# Patient Record
Sex: Female | Born: 1935 | Hispanic: Yes | Marital: Married | Smoking: Never smoker
Health system: Southern US, Community
[De-identification: ages and names within clinical notes are randomized; demographics above are authoritative.]

## PROBLEM LIST (undated history)

## (undated) DIAGNOSIS — C801 Malignant (primary) neoplasm, unspecified: Secondary | ICD-10-CM

## (undated) DIAGNOSIS — I10 Essential (primary) hypertension: Secondary | ICD-10-CM

## (undated) HISTORY — PX: BACK SURGERY: SHX140

---

## 2015-10-08 ENCOUNTER — Encounter (HOSPITAL_COMMUNITY): Payer: Self-pay | Admitting: Emergency Medicine

## 2015-10-08 ENCOUNTER — Emergency Department (HOSPITAL_COMMUNITY): Payer: Self-pay

## 2015-10-08 ENCOUNTER — Observation Stay (HOSPITAL_COMMUNITY)
Admission: EM | Admit: 2015-10-08 | Discharge: 2015-10-09 | Disposition: A | Discharge status: Home / Self Care | Payer: Self-pay | Attending: Internal Medicine | Admitting: Internal Medicine

## 2015-10-08 DIAGNOSIS — E871 Hypo-osmolality and hyponatremia: Secondary | ICD-10-CM | POA: Insufficient documentation

## 2015-10-08 DIAGNOSIS — R112 Nausea with vomiting, unspecified: Secondary | ICD-10-CM | POA: Insufficient documentation

## 2015-10-08 DIAGNOSIS — R197 Diarrhea, unspecified: Secondary | ICD-10-CM | POA: Insufficient documentation

## 2015-10-08 DIAGNOSIS — R6510 Systemic inflammatory response syndrome (SIRS) of non-infectious origin without acute organ dysfunction: Principal | ICD-10-CM | POA: Insufficient documentation

## 2015-10-08 DIAGNOSIS — K8020 Calculus of gallbladder without cholecystitis without obstruction: Secondary | ICD-10-CM | POA: Insufficient documentation

## 2015-10-08 DIAGNOSIS — E861 Hypovolemia: Secondary | ICD-10-CM | POA: Insufficient documentation

## 2015-10-08 DIAGNOSIS — R000 Tachycardia, unspecified: Secondary | ICD-10-CM | POA: Insufficient documentation

## 2015-10-08 DIAGNOSIS — R1011 Right upper quadrant pain: Secondary | ICD-10-CM

## 2015-10-08 DIAGNOSIS — A419 Sepsis, unspecified organism: Secondary | ICD-10-CM | POA: Diagnosis present

## 2015-10-08 DIAGNOSIS — R109 Unspecified abdominal pain: Secondary | ICD-10-CM | POA: Insufficient documentation

## 2015-10-08 DIAGNOSIS — I10 Essential (primary) hypertension: Secondary | ICD-10-CM | POA: Insufficient documentation

## 2015-10-08 DIAGNOSIS — Z85828 Personal history of other malignant neoplasm of skin: Secondary | ICD-10-CM | POA: Insufficient documentation

## 2015-10-08 DIAGNOSIS — E860 Dehydration: Secondary | ICD-10-CM | POA: Insufficient documentation

## 2015-10-08 HISTORY — DX: Essential (primary) hypertension: I10

## 2015-10-08 HISTORY — DX: Malignant (primary) neoplasm, unspecified: C80.1

## 2015-10-08 LAB — URINE MICROSCOPIC-ADD ON: WBC, UA: NONE SEEN WBC/hpf (ref 0–5)

## 2015-10-08 LAB — COMPREHENSIVE METABOLIC PANEL
ALBUMIN: 3.2 g/dL — AB (ref 3.5–5.0)
ALK PHOS: 74 U/L (ref 38–126)
ALT: 30 U/L (ref 14–54)
AST: 33 U/L (ref 15–41)
Anion gap: 8 (ref 5–15)
BILIRUBIN TOTAL: 0.8 mg/dL (ref 0.3–1.2)
BUN: 13 mg/dL (ref 6–20)
CALCIUM: 8 mg/dL — AB (ref 8.9–10.3)
CO2: 19 mmol/L — ABNORMAL LOW (ref 22–32)
Chloride: 102 mmol/L (ref 101–111)
Creatinine, Ser: 0.96 mg/dL (ref 0.44–1.00)
GFR calc Af Amer: >60 mL/min (ref >60)
GFR, EST NON AFRICAN AMERICAN: 55 mL/min — AB (ref >60)
GLUCOSE: 123 mg/dL — AB (ref 65–99)
POTASSIUM: 4.2 mmol/L (ref 3.5–5.1)
Sodium: 129 mmol/L — ABNORMAL LOW (ref 135–145)
TOTAL PROTEIN: 6.7 g/dL (ref 6.5–8.1)

## 2015-10-08 LAB — URINALYSIS, ROUTINE W REFLEX MICROSCOPIC
Bilirubin Urine: NEGATIVE
Glucose, UA: NEGATIVE mg/dL
KETONES UR: NEGATIVE mg/dL
LEUKOCYTES UA: NEGATIVE
NITRITE: NEGATIVE
PROTEIN: 30 mg/dL — AB
Specific Gravity, Urine: 1.019 (ref 1.005–1.030)
pH: 5 (ref 5.0–8.0)

## 2015-10-08 LAB — CBC WITH DIFFERENTIAL/PLATELET
BASOS ABS: 0 10*3/uL (ref 0.0–0.1)
Basophils Relative: 0 %
Eosinophils Absolute: 0 10*3/uL (ref 0.0–0.7)
Eosinophils Relative: 0 %
HEMATOCRIT: 38.1 % (ref 36.0–46.0)
HEMOGLOBIN: 12.6 g/dL (ref 12.0–15.0)
LYMPHS PCT: 13 %
Lymphs Abs: 1 10*3/uL (ref 0.7–4.0)
MCH: 30.4 pg (ref 26.0–34.0)
MCHC: 33.1 g/dL (ref 30.0–36.0)
MCV: 91.8 fL (ref 78.0–100.0)
MONO ABS: 0.5 10*3/uL (ref 0.1–1.0)
Monocytes Relative: 7 %
NEUTROS ABS: 6.2 10*3/uL (ref 1.7–7.7)
NEUTROS PCT: 80 %
Platelets: 196 10*3/uL (ref 150–400)
RBC: 4.15 MIL/uL (ref 3.87–5.11)
RDW: 12.8 % (ref 11.5–15.5)
WBC: 7.7 10*3/uL (ref 4.0–10.5)

## 2015-10-08 LAB — I-STAT CG4 LACTIC ACID, ED
Lactic Acid, Venous: 0.95 mmol/L (ref 0.5–2.0)
Lactic Acid, Venous: 0.47 mmol/L — ABNORMAL LOW (ref 0.5–2.0)

## 2015-10-08 LAB — CK: Total CK: 148 U/L (ref 38–234)

## 2015-10-08 LAB — LIPASE, BLOOD: Lipase: 21 U/L (ref 11–51)

## 2015-10-08 MED ORDER — ONDANSETRON HCL 4 MG/2ML IJ SOLN
4.0000 mg | Freq: Once | INTRAMUSCULAR | Status: AC
  Administered 2015-10-08: 4 mg via INTRAVENOUS
  Filled 2015-10-08: qty 2

## 2015-10-08 MED ORDER — SODIUM CHLORIDE 0.9 % IV BOLUS (SEPSIS)
1000.0000 mL | Freq: Once | INTRAVENOUS | Status: AC
  Administered 2015-10-08: 1000 mL via INTRAVENOUS

## 2015-10-08 MED ORDER — SODIUM CHLORIDE 0.9 % IV BOLUS (SEPSIS): 1000.0000 mL | Freq: Once | INTRAVENOUS | Status: DC

## 2015-10-08 MED ORDER — IOPAMIDOL (ISOVUE-370) INJECTION 76%
INTRAVENOUS | Status: AC
  Administered 2015-10-08: 100 mL
  Filled 2015-10-08: qty 100

## 2015-10-08 MED ORDER — ACETAMINOPHEN 325 MG PO TABS
650.0000 mg | ORAL_TABLET | Freq: Once | ORAL | Status: AC
  Administered 2015-10-08: 650 mg via ORAL
  Filled 2015-10-08: qty 2

## 2015-10-08 MED ORDER — SODIUM CHLORIDE 0.9 % IV SOLN: 1000.0000 mL | INTRAVENOUS | Status: DC

## 2015-10-08 MED ORDER — PIPERACILLIN-TAZOBACTAM 3.375 G IVPB 30 MIN
3.3750 g | Freq: Once | INTRAVENOUS | Status: AC
  Administered 2015-10-08: 3.375 g via INTRAVENOUS
  Filled 2015-10-08: qty 50

## 2015-10-08 MED ORDER — PIPERACILLIN-TAZOBACTAM 3.375 G IVPB
3.3750 g | Freq: Three times a day (TID) | INTRAVENOUS | Status: DC
  Administered 2015-10-09: 3.375 g via INTRAVENOUS
  Filled 2015-10-08 (×3): qty 50

## 2015-10-08 NOTE — ED Notes (Signed)
MD advised RN obtained PO temp EDP advised okay not to get the rectal.

## 2015-10-08 NOTE — ED Notes (Signed)
Patient brought back to the room. Patient connected to the monitor at this time. Family at bedside.

## 2015-10-08 NOTE — Progress Notes (Signed)
Pharmacy Antibiotic Note  Megan Watts is a 82 y.o. female admitted on 10/08/2015 with sepsis.  Pharmacy has been consulted for Zosyn dosing.  WBC 7.7, Tmax 100.53F. Cr 0.96, CrCl ~40 ml/min. Tachycardic to 130s. Received Zosyn 3.375g x 1 in ED.  Plan: Zosyn 3.375g IV q8h (4 hour infusion).  Height: 5' 2.21" (158 cm) Weight: 137 lb (62.143 kg) IBW/kg (Calculated) : 50.57  Temp (24hrs), Avg:99.7 F (37.6 C), Min:98.8 F (37.1 C), Max:100.6 F (38.1 C)   Recent Labs Lab 10/08/15 1617  WBC 7.7  CREATININE 0.96    Estimated Creatinine Clearance: 41.4 mL/min (by C-G formula based on Cr of 0.96).    Allergies  Allergen Reactions  . Codeine     Sleep all the time. Made my face red   . Dexamethasone     Rash on face   . Diclofenac     Rash on face   . Tramadol     Shaking     Antimicrobials this admission: 5/23 Zosyn >>    Dose adjustments this admission: n/a  Microbiology results: 5/23 BCx:  5/23 UCx:   5/23 GI panel  Thank you for allowing pharmacy to be a part of this patient's care.  Wynelle Fanny 10/08/2015 6:36 PM

## 2015-10-08 NOTE — H&P (Addendum)
History and Physical    Megan Watts [id number] DOB: July 27, 1933 DOA: 10/08/2015  Referring MD/NP/PA: Dr. PCP: No PCP Per Patient  Patient coming from: Home  Chief Complaint: Nausea, vomiting, and diarrhea  HPI: Megan Watts is a 82 y.o. female with medical history significant of hypertension and skin cancer s/p removal; who presents with complaints of nausea, vomiting, and diarrhea last 3 days. Patient is Spanish-speaking only and visiting from [place] since last week. The patient's son helps interpret for the patient. She reports having to 3 episodes of nausea and vomiting per day and up to 10 episodes of loose watery stools per day since the onset of symptoms. Only able to eat small amounts of food. Denies  anything in particular worsening/relieving symptoms. Recent sick contacts include her granddaughter who had similar illness last week. She had two 2 hour plane flights to get here and denies having any lower leg swelling or pain. Associated symptoms include fatigue, increased lethargy, decreased appetite, intermittent right-sided abdominal pain that was sharp( now resolved).  Patient denies any significant cough,  shortness of breath, chest pain, palpitations, blood in urine or stool, rash, change in vision, or focal weakness.   ED Course: Upon admission patient was evaluated and seen to have fever of 100.54F, heart rates up to 136, otherwise all other vital signs within normal limits. Lab work was relatively unremarkable except for sodium of 129, bicarbonate 19. Urinalysis was unremarkable. Chest x-ray showing signs of a patchy right middle lobe opacity. CT angiogram of the chest showing only signs of some atelectasis, but no acute pulmonary infiltrate or pulmonary embolism. Patient had been empirically given Zosyn for the possibility of aspiration pneumonia.   Review of Systems: As per HPI otherwise 10 point review of systems negative.   Past Medical History   Diagnosis Date  . Hypertension   . Cancer [facility])     Past Surgical History  Procedure Laterality Date  . Back surgery       reports that she has never smoked. She does not have any smokeless tobacco history on file. She reports that she does not drink alcohol or use illicit drugs.  Allergies  Allergen Reactions  . Codeine     Sleep all the time. Made my face red   . Dexamethasone     Rash on face   . Diclofenac     Rash on face   . Tramadol     Shaking     Family history reviewed and patient provides no pertinent history.  Prior to Admission medications   Medication Sig Start Date End Date Taking? Authorizing Provider  Carvedilol (COREG PO) Take 1 tablet by mouth 2 (two) times daily. Patient was unaware of dose   Yes Historical Provider, MD  losartan (COZAAR) 50 MG tablet Take 50 mg by mouth daily.   Yes Historical Provider, MD    Physical Exam: Filed Vitals:   10/08/15 2200 10/08/15 2215 10/08/15 2230 10/08/15 2338  BP: 107/62 109/65 116/66   Pulse: 80 78 79 98  Temp:      TempSrc:      Resp: 19 10 18 16   Height:      Weight:      SpO2: 97% 96% 97% 99%      Constitutional: NAD, calm, comfortable Filed Vitals:   10/08/15 2200 10/08/15 2215 10/08/15 2230 10/08/15 2338  BP: 107/62 109/65 116/66   Pulse: 80 78 79 98  Temp:      TempSrc:  Resp: 19 10 18 16   Height:      Weight:      SpO2: 97% 96% 97% 99%   Eyes: PERRL, lids and conjunctivae normal ENMT: Mucous membranes are moist. Posterior pharynx clear of any exudate or lesions.Normal dentition.  Neck: normal, supple, no masses, no thyromegaly Respiratory: clear to auscultation bilaterally, no wheezing, no crackles. Normal respiratory effort. No accessory muscle use.  Cardiovascular: Regular rate and rhythm, no murmurs / rubs / gallops. No extremity edema. 2+ pedal pulses. No carotid bruits.  Abdomen: no tenderness, no masses palpated. No hepatosplenomegaly. Bowel sounds positive.  Musculoskeletal:  no clubbing / cyanosis. No joint deformity upper and lower extremities. Good ROM, no contractures. Normal muscle tone.  Skin: no rashes, lesions, ulcers. No induration Neurologic: CN 2-12 grossly intact. Sensation intact, DTR normal. Strength 5/5 in all 4.  Psychiatric: Normal judgment and insight. Alert and oriented x 3. Normal mood.     Labs on Admission: I have personally reviewed following labs and imaging studies  CBC:  Recent Labs Lab 10/08/15 1617  WBC 7.7  NEUTROABS 6.2  HGB 12.6  HCT 38.1  MCV 91.8  PLT 123456   Basic Metabolic Panel:  Recent Labs Lab 10/08/15 1617  NA 129*  K 4.2  CL 102  CO2 19*  GLUCOSE 123*  BUN 13  CREATININE 0.96  CALCIUM 8.0*   GFR: Estimated Creatinine Clearance: 41.4 mL/min (by C-G formula based on Cr of 0.96). Liver Function Tests:  Recent Labs Lab 10/08/15 1617  AST 33  ALT 30  ALKPHOS 74  BILITOT 0.8  PROT 6.7  ALBUMIN 3.2*    Recent Labs Lab 10/08/15 1617  LIPASE 21   No results for input(s): AMMONIA in the last 168 hours. Coagulation Profile: No results for input(s): INR, PROTIME in the last 168 hours. Cardiac Enzymes:  Recent Labs Lab 10/08/15 1812  CKTOTAL 148   BNP (last 3 results) No results for input(s): PROBNP in the last 8760 hours. HbA1C: No results for input(s): HGBA1C in the last 72 hours. CBG: No results for input(s): GLUCAP in the last 168 hours. Lipid Profile: No results for input(s): CHOL, HDL, LDLCALC, TRIG, CHOLHDL, LDLDIRECT in the last 72 hours. Thyroid Function Tests: No results for input(s): TSH, T4TOTAL, FREET4, T3FREE, THYROIDAB in the last 72 hours. Anemia Panel: No results for input(s): VITAMINB12, FOLATE, FERRITIN, TIBC, IRON, RETICCTPCT in the last 72 hours. Urine analysis:    Component Value Date/Time   COLORURINE YELLOW 10/08/2015 [place] 10/08/2015 1657   LABSPEC 1.019 10/08/2015 1657   PHURINE 5.0 10/08/2015 1657   GLUCOSEU NEGATIVE 10/08/2015 1657    HGBUR MODERATE* 10/08/2015 1657   [place] 10/08/2015 [place] 10/08/2015 1657   PROTEINUR 30* 10/08/2015 1657   NITRITE NEGATIVE 10/08/2015 1657   LEUKOCYTESUR NEGATIVE 10/08/2015 1657   Sepsis Labs: No results found for this or any previous visit (from the past 240 hour(s)).   Radiological Exams on Admission: Dg Chest 2 View  10/08/2015  CLINICAL DATA:  Fever EXAM: CHEST  2 VIEW COMPARISON:  None. FINDINGS: Normal heart size. Normal mediastinal contour. No pneumothorax. No pleural effusion. Patchy right middle lobe opacity. No overt pulmonary edema. IMPRESSION: Patchy right middle lobe opacity, which could represent a pneumonia. Recommend follow-up PA and lateral post treatment chest radiographs in 4-6 weeks. Electronically Signed   By: Ilona Sorrel M.D.   On: 10/08/2015 19:24    EKG: Independently reviewed. Sinus tachycardia  Assessment/Plan  SIRS/ Sepsis with unknown source: Acute. Patient with complaints of abdominal pain ,nausea, vomiting, and diarrhea. Fever up to 100.76F, tachycardia and heart rates up to 136. Urinalysis clear of any signs of infection. Chest x-ray showing possible right lower lobe filtrate versus atelectasis. CT angiogram of the chest showing no signs of a pneumonia only atelectasis. Question underlying source at this time to be possibly intra-abdominal like secondary to gastroenteritis that could be bacterial. - Admit to a telemetry bed - Sepsis protocol initiated - Follow-up cultures  - Empiric antibiotics of Zosyn started, descalate when able  Nausea, vomiting, and diarrhea -  Zofran prn - Follow-up gastrointestinal panel  Intermittent right upper quadrant abdominal pain: CT anterior showing cholelithiasis. - Check abdominal ultrasound   Sinus tachycardia - Follow telemetry overnight - Check troponin    Essential hypertension   - Continue losartan - Will need to verify coreg dose  Hyponatremia: Acute. Sodium 129 on  admission with decrease CO2 of 19 suspect related to recent diarrhea - Follow repeat BMP in a.m.  DVT prophylaxis:   Lovenox  Code Status: Full Family Communication: Discussed plan with patient's son present at bedside Disposition Plan: Possible discharge in 1-2 days Consults called: None Admission status: Telemetry observation    Norval Morton MD [facility] Pager [phone]  If 7PM-7AM, please contact night-coverage [email or web address] Password [facility]  10/08/2015, 11:53 PM

## 2015-10-08 NOTE — ED Notes (Addendum)
Pt visiting here from [place] since last week.  Has had nausea, vomiting and diarrhea x's 3 days.  Pt also c/o abd pain, right side of head pain and right ear pain

## 2015-10-08 NOTE — ED Notes (Signed)
MD at bedside, HR noted to be increased to 130s, EKG completed.

## 2015-10-08 NOTE — Progress Notes (Signed)
Pharmacy Code Sepsis Protocol  Time of code sepsis page: [id number] []  Antibiotics delivered at  [x]  Antibiotics administered prior to code at [place] (if checked, omit next 2 questions)  Were antibiotics ordered at the time of the code sepsis page? Yes Was it required to contact the physician? []  Physician not contacted []  Physician contacted to order antibiotics for code sepsis []  Physician contacted to recommend changing antibiotics  Pharmacy consulted for: Zosyn  Anti-infectives    Start     Dose/Rate Route Frequency Ordered Stop   10/09/15 0600  piperacillin-tazobactam (ZOSYN) IVPB 3.375 g     3.375 g 12.5 mL/hr over 240 Minutes Intravenous Every 8 hours 10/08/15 1836     10/08/15 1830  piperacillin-tazobactam (ZOSYN) IVPB 3.375 g     3.375 g 100 mL/hr over 30 Minutes Intravenous  Once 10/08/15 1822          Nurse education provided: []  Minutes left to administer antibiotics to achieve 1 hour goal []  Correct order of antibiotic administration []  Antibiotic Y-site compatibilities     Wynelle Fanny, PharmD 10/08/2015, 6:46 PM

## 2015-10-08 NOTE — ED Notes (Signed)
Called CT advised patient has one person ahead.

## 2015-10-08 NOTE — ED Provider Notes (Signed)
CSN: [id number]     Arrival date & time 10/08/15  1518 History   First MD Initiated Contact with Patient 10/08/15 1701     Chief Complaint  Patient presents with  . Emesis   The history is provided by the patient and a relative. No language interpreter was used (Patient preferred son to translate).   Patient is a 82 year old female with past medical history of high blood pressure and ulcers who presents with nausea, vomiting and diarrhea for the past 3 days. Patient reports she started having watery diarrhea on 3 days ago. She has had about 10 episodes since then. Denies blood or dark tarry stools. Yesterday she began having vomiting and has had about 3-4 episodes of non-bloody nonbilious vomiting daily. She has some associated right upper quadrant and epigastric abdominal cramping that is mild. She also has a mild right-sided headache, generalized fatigue and chills. Denies fever, blurry vision, double vision, unilateral numbness or weakness. Denies dysuria, hematuria or urinary frequency. She does have sick contacts as her granddaughter was visiting last week and had similar symptoms. Denies any suspicious food intake. Patient is here visiting from [place]. She reports a history of some type of surgery for an ulcer in the past but denies any other abdominal surgeries.  Past Medical History  Diagnosis Date  . Hypertension    Past Surgical History  Procedure Laterality Date  . Back surgery     No family history on file. Social History  Substance Use Topics  . Smoking status: Never Smoker   . Smokeless tobacco: None  . Alcohol Use: No   OB History    No data available     Review of Systems  Constitutional: Positive for chills and fatigue. Negative for fever.  HENT: Negative for congestion and rhinorrhea.   Eyes: Negative for photophobia and visual disturbance.  Respiratory: Negative for cough and shortness of breath.   Cardiovascular: Negative for chest pain.  Gastrointestinal:  Positive for nausea, vomiting, abdominal pain and diarrhea. Negative for blood in stool.  Genitourinary: Negative for dysuria, hematuria and difficulty urinating.  Musculoskeletal: Negative for back pain and neck pain.  Skin: Negative for pallor and rash.  Neurological: Positive for headaches. Negative for dizziness, light-headedness and numbness.  Psychiatric/Behavioral: Negative for confusion.  All other systems reviewed and are negative.     Allergies  Review of patient's allergies indicates not on file.  Home Medications   Prior to Admission medications   Not on File   BP 137/70 mmHg  Pulse 92  Temp(Src) 98.8 F (37.1 C) (Oral)  Resp 16  Wt 62 kg  SpO2 95% Physical Exam  Constitutional: She is oriented to person, place, and time. She appears well-developed and well-nourished. No distress.  HENT:  Head: Normocephalic and atraumatic.  Eyes: EOM are normal. Pupils are equal, round, and reactive to light.  Neck: Normal range of motion. Neck supple.  Cardiovascular: Normal rate, regular rhythm and intact distal pulses.   Pulmonary/Chest: Effort normal and breath sounds normal. No respiratory distress.  Abdominal: Soft. She exhibits no distension and no mass. There is no tenderness. There is no rebound and no guarding.  Musculoskeletal: Normal range of motion. She exhibits no edema or tenderness.  Neurological: She is alert and oriented to person, place, and time.  Skin: Skin is warm and dry. No rash noted.  Psychiatric: She has a normal mood and affect.  Nursing note and vitals reviewed.   ED Course  Procedures (including critical care  time) Labs Review Labs Reviewed  COMPREHENSIVE METABOLIC PANEL - Abnormal; Notable for the following:    Sodium 129 (*)    CO2 19 (*)    Glucose, Bld 123 (*)    Calcium 8.0 (*)    Albumin 3.2 (*)    GFR calc non Af Amer 55 (*)    All other components within normal limits  URINALYSIS, ROUTINE W REFLEX MICROSCOPIC (NOT AT [facility]) -  Abnormal; Notable for the following:    Hgb urine dipstick MODERATE (*)    Protein, ur 30 (*)    All other components within normal limits  URINE MICROSCOPIC-ADD ON - Abnormal; Notable for the following:    Squamous Epithelial / LPF 0-5 (*)    Bacteria, UA RARE (*)    All other components within normal limits  CBC WITH DIFFERENTIAL/PLATELET  LIPASE, BLOOD    Imaging Review Dg Chest 2 View  10/08/2015  CLINICAL DATA:  Fever EXAM: CHEST  2 VIEW COMPARISON:  None. FINDINGS: Normal heart size. Normal mediastinal contour. No pneumothorax. No pleural effusion. Patchy right middle lobe opacity. No overt pulmonary edema. IMPRESSION: Patchy right middle lobe opacity, which could represent a pneumonia. Recommend follow-up PA and lateral post treatment chest radiographs in 4-6 weeks. Electronically Signed   By: Ilona Sorrel M.D.   On: 10/08/2015 19:24   Ct Angio Chest Pe W/cm &/or Wo Cm  10/08/2015  CLINICAL DATA:  Tachycardia. EXAM: CT ANGIOGRAPHY CHEST WITH CONTRAST TECHNIQUE: Multidetector CT imaging of the chest was performed using the standard protocol during bolus administration of intravenous contrast. Multiplanar CT image reconstructions and MIPs were obtained to evaluate the vascular anatomy. CONTRAST:  100 cc Isovue 370 IV COMPARISON:  Chest radiograph earlier this day. FINDINGS: There are no filling defects within the pulmonary arteries to suggest pulmonary embolus. Normal caliber thoracic aorta with minimal atherosclerosis. No evidence of dissection. No mediastinal or hilar adenopathy. No pericardial effusion. Motion artifact through the lung apices. Mild atelectasis in the lingula and right middle lobe. No confluent airspace disease, particularly in the right middle lobe. No findings of pulmonary edema. Mild distal esophageal wall thickening. Upper abdomen evaluation demonstrates calcified gallstones with and prominent gallbladder. Degenerative change in the thoracic spine. There are no acute or  suspicious osseous abnormalities. Review of the MIP images confirms the above findings. IMPRESSION: 1. No pulmonary embolus. 2. Atelectasis in the right middle lobe and lingula. No confluent airspace disease to suggest pneumonia. 3. Cholelithiasis noted in the upper abdomen. Electronically Signed   By: Jeb Levering M.D.   On: 10/08/2015 23:55   I have personally reviewed and evaluated these images and lab results as part of my medical decision-making.   EKG Interpretation   Date/Time:  Tuesday Oct 08 2015 20:14:10 EDT Ventricular Rate:  130 PR Interval:  146 QRS Duration: 95 QT Interval:  310 QTC Calculation: 456 R Axis:   -5 Text Interpretation:  Sinus or ectopic atrial tachycardia Low voltage,  extremity leads Baseline wander in lead(s) V2 No significant change was  found Confirmed by Wyvonnia Dusky  MD, STEPHEN [phone]) on 10/08/2015 8:20:50 PM      MDM   Final diagnoses:  Tachycardia  Sepsis ([place])  Abdominal pain   Initially afebrile, normotensive, HR in 90s. Abdominal exam with mild TTP over epigastric and RUQ without peritoneal signs. CBC unremarkable.  Mild hyponatremia, bicarb borderline low at 19, mild hyperglycemia. No anion gap. Renal function WNL. UA positive for hemoglobin but negative for RBCs. CK WNL.  While in the ED, patient became tachycardic to the 130s and febrile with 100.6 rectal temperature. EKG shows sinus tachycardia. Code sepsis was initiated. Blood and urine cultures are obtained. Lactic acid is not elevated. Patient was start started empirically on Zosyn for intrabdominal process versus aspiration pneumonia. IV fluids given. CTA chest negative for PE.  Given unexplained tachycardia, fever, and possible aspiration pneumonia vs intrabdominal process, hospitalist consulted for admission and triage of patient. Will be admitted for telemetry monitoring.   Patient seen and discussed with Dr. Wyvonnia Dusky, ED attending.   Gibson Ramp, MD 10/09/15 [id number]  Ezequiel Essex, MD 10/09/15 1300

## 2015-10-09 ENCOUNTER — Observation Stay (HOSPITAL_COMMUNITY): Payer: Self-pay

## 2015-10-09 DIAGNOSIS — J690 Pneumonitis due to inhalation of food and vomit: Secondary | ICD-10-CM | POA: Insufficient documentation

## 2015-10-09 DIAGNOSIS — I10 Essential (primary) hypertension: Secondary | ICD-10-CM

## 2015-10-09 DIAGNOSIS — R000 Tachycardia, unspecified: Secondary | ICD-10-CM

## 2015-10-09 DIAGNOSIS — A419 Sepsis, unspecified organism: Secondary | ICD-10-CM | POA: Diagnosis present

## 2015-10-09 DIAGNOSIS — R6510 Systemic inflammatory response syndrome (SIRS) of non-infectious origin without acute organ dysfunction: Secondary | ICD-10-CM

## 2015-10-09 DIAGNOSIS — R112 Nausea with vomiting, unspecified: Secondary | ICD-10-CM | POA: Diagnosis present

## 2015-10-09 DIAGNOSIS — R197 Diarrhea, unspecified: Secondary | ICD-10-CM

## 2015-10-09 DIAGNOSIS — E871 Hypo-osmolality and hyponatremia: Secondary | ICD-10-CM

## 2015-10-09 DIAGNOSIS — R109 Unspecified abdominal pain: Secondary | ICD-10-CM | POA: Diagnosis present

## 2015-10-09 LAB — CBC
HCT: 34.5 % — ABNORMAL LOW (ref 36.0–46.0)
Hemoglobin: 11.2 g/dL — ABNORMAL LOW (ref 12.0–15.0)
MCH: 30.3 pg (ref 26.0–34.0)
MCHC: 32.5 g/dL (ref 30.0–36.0)
MCV: 93.2 fL (ref 78.0–100.0)
PLATELETS: 160 10*3/uL (ref 150–400)
RBC: 3.7 MIL/uL — ABNORMAL LOW (ref 3.87–5.11)
RDW: 13.1 % (ref 11.5–15.5)
WBC: 5.9 10*3/uL (ref 4.0–10.5)

## 2015-10-09 LAB — HEPATIC FUNCTION PANEL
ALBUMIN: 2.6 g/dL — AB (ref 3.5–5.0)
ALT: 27 U/L (ref 14–54)
AST: 32 U/L (ref 15–41)
Alkaline Phosphatase: 58 U/L (ref 38–126)
Bilirubin, Direct: 0.2 mg/dL (ref 0.1–0.5)
Indirect Bilirubin: 0.6 mg/dL (ref 0.3–0.9)
TOTAL PROTEIN: 5.3 g/dL — AB (ref 6.5–8.1)
Total Bilirubin: 0.8 mg/dL (ref 0.3–1.2)

## 2015-10-09 LAB — TROPONIN I

## 2015-10-09 LAB — URINE CULTURE

## 2015-10-09 LAB — BASIC METABOLIC PANEL
ANION GAP: 4 — AB (ref 5–15)
BUN: 10 mg/dL (ref 6–20)
CALCIUM: 7.3 mg/dL — AB (ref 8.9–10.3)
CO2: 21 mmol/L — AB (ref 22–32)
CREATININE: 1.05 mg/dL — AB (ref 0.44–1.00)
Chloride: 111 mmol/L (ref 101–111)
GFR calc non Af Amer: 49 mL/min — ABNORMAL LOW (ref >60)
GFR, EST AFRICAN AMERICAN: 57 mL/min — AB (ref >60)
Glucose, Bld: 97 mg/dL (ref 65–99)
Potassium: 4.5 mmol/L (ref 3.5–5.1)
Sodium: 136 mmol/L (ref 135–145)

## 2015-10-09 MED ORDER — ACETAMINOPHEN 650 MG RE SUPP: 650.0000 mg | Freq: Four times a day (QID) | RECTAL | Status: DC | PRN

## 2015-10-09 MED ORDER — ENOXAPARIN SODIUM 40 MG/0.4ML ~~LOC~~ SOLN: 40.0000 mg | SUBCUTANEOUS | Status: DC

## 2015-10-09 MED ORDER — ONDANSETRON HCL 4 MG PO TABS: 4.0000 mg | ORAL_TABLET | Freq: Four times a day (QID) | ORAL | Status: DC | PRN

## 2015-10-09 MED ORDER — ONDANSETRON HCL 4 MG/2ML IJ SOLN: 4.0000 mg | Freq: Four times a day (QID) | INTRAMUSCULAR | Status: DC | PRN

## 2015-10-09 MED ORDER — PNEUMOCOCCAL VAC POLYVALENT 25 MCG/0.5ML IJ INJ: 0.5000 mL | INJECTION | INTRAMUSCULAR | Status: DC

## 2015-10-09 MED ORDER — CARVEDILOL 3.125 MG PO TABS: 3.1250 mg | ORAL_TABLET | Freq: Two times a day (BID) | ORAL | Status: DC

## 2015-10-09 MED ORDER — CARVEDILOL 6.25 MG PO TABS: 6.2500 mg | ORAL_TABLET | Freq: Two times a day (BID) | ORAL | Status: AC

## 2015-10-09 MED ORDER — SODIUM CHLORIDE 0.9% FLUSH
3.0000 mL | Freq: Two times a day (BID) | INTRAVENOUS | Status: DC
  Administered 2015-10-09: 3 mL via INTRAVENOUS

## 2015-10-09 MED ORDER — ACETAMINOPHEN 325 MG PO TABS: 650.0000 mg | ORAL_TABLET | Freq: Four times a day (QID) | ORAL | Status: DC | PRN

## 2015-10-09 MED ORDER — LOSARTAN POTASSIUM 50 MG PO TABS
50.0000 mg | ORAL_TABLET | Freq: Every day | ORAL | Status: DC
  Administered 2015-10-09: 50 mg via ORAL
  Filled 2015-10-09: qty 1

## 2015-10-09 MED ORDER — SODIUM CHLORIDE 0.9 % IV SOLN
1000.0000 mL | INTRAVENOUS | Status: DC
  Administered 2015-10-09: 1000 mL via INTRAVENOUS

## 2015-10-09 NOTE — Care Management Note (Signed)
Case Management Note  Patient Details  Name: Megan Watts MRN: [id number] Date of Birth: 1934-05-04  Subjective/Objective:                 Spoke with patient and son in room. Son interpreted. Patient here for next month staying with son. Is from [place]. Would like to follow up at [facility] as needed at discharge. Pamphlet provided. Son states that patient has medical coverage from [place], unsure what coverage would be in [place], info was provided to admissions per son.   Action/Plan:  Will continue to follow for DC planning needs.   Expected Discharge Date:                  Expected Discharge Plan:  Home/Self Care  In-House Referral:     Discharge planning Services  CM Consult, [facility]  Post Acute Care Choice:    Choice offered to:     DME Arranged:    DME Agency:     HH Arranged:    HH Agency:     Status of Service:  In process, will continue to follow  Medicare Important Message Given:    Date Medicare IM Given:    Medicare IM give by:    Date Additional Medicare IM Given:    Additional Medicare Important Message give by:     If discussed at [place] of Stay Meetings, dates discussed:    Additional Comments:  Carles Collet, RN 10/09/2015, 12:52 PM

## 2015-10-09 NOTE — Progress Notes (Signed)
Pt arrived floor with son. MD made aware. Rapid response called. Continuous fluid started as ordered. ED called about an outstanding fluid bolus. The ED nurse says pt has had 3 liters of fluid. MD was asked about completion of the order to check vitals after the completion of bolus, MD said the order should have been completed in the ED  And that he would discontinue this order. Pt is resting well. Will continue to monitor.

## 2015-10-09 NOTE — Progress Notes (Signed)
Pt given discharge instructions, prescriptions, and care notes. Pt verbalized understanding AEB no further questions or concerns at this time. IV was discontinued, no redness, pain, or swelling noted at this time. Telemetry discontinued and Centralized Telemetry was notified. Pt left the floor via ambulation with staff in stable condition.

## 2015-10-09 NOTE — Discharge Summary (Addendum)
[facility] Discharge Summary   Patient: Megan Watts [id number]   PCP: No PCP Per Patient DOB: 27-Dec-1933   Date of admission: 10/08/2015   Date of discharge:  10/09/2015    Discharge Diagnoses:  Principal Problem:   SIRS (systemic inflammatory response syndrome) ([place]) Active Problems:   Abdominal pain   Sinus tachycardia ([place])   Essential hypertension   Hyponatremia   Nausea vomiting and diarrhea  Recommendations for Outpatient Follow-up:  1. Monitor blood pressure at home and follow-up with PCP in one week   Follow-up Information    Follow up with [facility] On 10/30/2015.   Specialty:  Internal Medicine   Why:   at 09:30. primary care provider in 1 week for blood pressure   Contact information:   [place] [phone]      Follow up with [facility].   Why:  To get Medications after discharge, As needed   Contact information:   [place] [phone]     Diet recommendation: Low-salt diet and encourage fluids  Activity: The patient is advised to gradually reintroduce usual activities.  Discharge Condition: good  History of present illness: As per the H and P dictated on admission, "Megan Watts is a 82 y.o. female with medical history significant of hypertension and skin cancer s/p removal; who presents with complaints of nausea, vomiting, and diarrhea last 3 days. Patient is Spanish-speaking only and visiting from [place] since last week. The patient's son helps interpret for the patient. She reports having to 3 episodes of nausea and vomiting per day and up to 10 episodes of loose watery stools per day since the onset of symptoms. Only able to eat small amounts of food. Denies anything in particular worsening/relieving symptoms. Recent sick contacts include her granddaughter who had similar illness last  week. She had two 2 hour plane flights to get here and denies having any lower leg swelling or pain. Associated symptoms include fatigue, increased lethargy, decreased appetite, intermittent right-sided abdominal pain that was sharp( now resolved). Patient denies any significant cough, shortness of breath, chest pain, palpitations, blood in urine or stool, rash, change in vision, or focal weakness.   ED Course: Upon admission patient was evaluated and seen to have fever of 100.63F, heart rates up to 136, otherwise all other vital signs within normal limits. Lab work was relatively unremarkable except for sodium of 129, bicarbonate 19. Urinalysis was unremarkable. Chest x-ray showing signs of a patchy right middle lobe opacity. CT angiogram of the chest showing only signs of some atelectasis, but no acute pulmonary infiltrate or pulmonary embolism. Patient had been empirically given Zosyn for the possibility of aspiration pneumonia."  Hospital Course:  Summary of her active problems in the hospital is as following.  Principal Problem:   SIRS (systemic inflammatory response syndrome) ([facility]) No evidence of ongoing active infection. Ultrasound abdomen shows gallstone without any cholecystitis CT in June of chest negative for any evidence of pneumonia. Shows evidence of atelectasis. Normal lactic acid level. Normal serum creatinine. Normal CK and normal troponin normal LFT. Hyponatremia improved with IV hydration. No evidence of leukocytosis. Unremarkable UA.  With this finding patient's presentation is likely secondary to dehydration from episode of diarrhea that happened on Sunday night. Her diarrhea appears to be secondary to viral gastroenteritis or food poisoning which currently has resolved. Patient is able to tolerate diet without  any nausea or vomiting and did not have any episodes of diarrhea in the last 48 hours.  Given this information at present I do not believe that the patient  requires any antibiotics. Patient can safely be discharged home since she is able to tolerate diet. Normal orthostatics.  Active Problems:   Essential hypertension Patient has been on 2 antihypertensive medication at present. She mentions she has been on this medication since long. At present given that her blood pressure has been well controlled I recommend to discontinue losartan and start Coreg in 3 days.    Hyponatremia Likely from hypovolemia. Next and currently resolved.    Nausea vomiting and diarrhea Likely from viral gastroenteritis currently resolved.   All other chronic medical condition were stable during the hospitalization.  Patient was ambulatory without any assistance. On the day of the discharge the patient's vitals are stable, diarrhea resolved, no abdominal pain, and no other acute medical condition were reported by patient. the patient was felt safe to be discharge at home with family.  Procedures and Results:  none   Consultations:  none  DISCHARGE MEDICATION: Current Discharge Medication List    CONTINUE these medications which have CHANGED   Details  carvedilol (COREG) 6.25 MG tablet Take 1 tablet (6.25 mg total) by mouth 2 (two) times daily.      STOP taking these medications     losartan (COZAAR) 50 MG tablet        Allergies  Allergen Reactions  . Codeine     Sleep all the time. Made my face red   . Dexamethasone     Rash on face   . Diclofenac     Rash on face   . Tramadol     Shaking    Discharge Instructions    Diet - low sodium heart healthy    Complete by:  As directed      Discharge instructions    Complete by:  As directed   It is important that you read following instructions as well as go over your medication list with RN to help you understand your care after this hospitalization.  Discharge Instructions: Please follow-up with PCP in one week, please check your blood pressure daily.  Please request your primary care  physician to go over all Hospital Tests and Procedure/Radiological results at the follow up,  Please get all Hospital records sent to your PCP by signing hospital release before you go home.   Do not take more than prescribed Pain, Sleep and Anxiety Medications. You were cared for by a hospitalist during your hospital stay. If you have any questions about your discharge medications or the care you received while you were in the hospital after you are discharged, you can call the unit and ask to speak with the hospitalist on call if the hospitalist that took care of you is not available.  Once you are discharged, your primary care physician will handle any further medical issues. Please note that NO REFILLS for any discharge medications will be authorized once you are discharged, as it is imperative that you return to your primary care physician (or establish a relationship with a primary care physician if you do not have one) for your aftercare needs so that they can reassess your need for medications and monitor your lab values. You Must read complete instructions/literature along with all the possible adverse reactions/side effects for all the Medicines you take and that have been prescribed to you. Take any new  Medicines after you have completely understood and accept all the possible adverse reactions/side effects. Wear Seat belts while driving. If you have smoked or chewed Tobacco in the last 2 yrs please stop smoking and/or stop any Recreational drug use.     Increase activity slowly    Complete by:  As directed           Discharge Exam: Filed Weights   10/08/15 1550 10/08/15 1834  Weight: 62 kg (136 lb 11 oz) 62.143 kg (137 lb)   Filed Vitals:   10/09/15 0530 10/09/15 1418  BP: 105/60 114/58  Pulse: 80 75  Temp: 98.6 F (37 C) 98.5 F (36.9 C)  Resp: 16 18   General: Appear in no distress, no Rash; Oral Mucosa moist. Cardiovascular: S1 and S2 Present, no Murmur, no  JVD Respiratory: Bilateral Air entry present and Clear to Auscultation, no Crackles, no wheezes Abdomen: Bowel Sound present, Soft and no tenderness Extremities: no Pedal edema, no calf tenderness Neurology: Grossly no focal neuro deficit.  The results of significant diagnostics from this hospitalization (including imaging, microbiology, ancillary and laboratory) are listed below for reference.    Significant Diagnostic Studies: Dg Chest 2 View  10/08/2015  CLINICAL DATA:  Fever EXAM: CHEST  2 VIEW COMPARISON:  None. FINDINGS: Normal heart size. Normal mediastinal contour. No pneumothorax. No pleural effusion. Patchy right middle lobe opacity. No overt pulmonary edema. IMPRESSION: Patchy right middle lobe opacity, which could represent a pneumonia. Recommend follow-up PA and lateral post treatment chest radiographs in 4-6 weeks. Electronically Signed   By: Ilona Sorrel M.D.   On: 10/08/2015 19:24   Ct Angio Chest Pe W/cm &/or Wo Cm  10/08/2015  CLINICAL DATA:  Tachycardia. EXAM: CT ANGIOGRAPHY CHEST WITH CONTRAST TECHNIQUE: Multidetector CT imaging of the chest was performed using the standard protocol during bolus administration of intravenous contrast. Multiplanar CT image reconstructions and MIPs were obtained to evaluate the vascular anatomy. CONTRAST:  100 cc Isovue 370 IV COMPARISON:  Chest radiograph earlier this day. FINDINGS: There are no filling defects within the pulmonary arteries to suggest pulmonary embolus. Normal caliber thoracic aorta with minimal atherosclerosis. No evidence of dissection. No mediastinal or hilar adenopathy. No pericardial effusion. Motion artifact through the lung apices. Mild atelectasis in the lingula and right middle lobe. No confluent airspace disease, particularly in the right middle lobe. No findings of pulmonary edema. Mild distal esophageal wall thickening. Upper abdomen evaluation demonstrates calcified gallstones with and prominent gallbladder. Degenerative  change in the thoracic spine. There are no acute or suspicious osseous abnormalities. Review of the MIP images confirms the above findings. IMPRESSION: 1. No pulmonary embolus. 2. Atelectasis in the right middle lobe and lingula. No confluent airspace disease to suggest pneumonia. 3. Cholelithiasis noted in the upper abdomen. Electronically Signed   By: Jeb Levering M.D.   On: 10/08/2015 23:55   Dg Chest Port 1 View  10/09/2015  CLINICAL DATA:  Sepsis. Nausea, vomiting, diarrhea for 3 days. Tachycardia. EXAM: PORTABLE CHEST 1 VIEW COMPARISON:  CT 1 hour prior.  Radiographs yesterday. FINDINGS: Lingular and right middle lobe atelectasis, better appreciated on CT. No new confluent airspace disease. Cardiomediastinal contours are unchanged. No new abnormalities seen. IMPRESSION: Lingular and right middle lobe atelectasis, better appreciated on CT. No new abnormality is seen. Electronically Signed   By: Jeb Levering M.D.   On: 10/09/2015 00:44   US Abdomen Limited Ruq  10/09/2015  CLINICAL DATA:  Abdominal pain EXAM: US ABDOMEN LIMITED - RIGHT  UPPER QUADRANT COMPARISON:  None. FINDINGS: Gallbladder: Cholelithiasis. Gallstones measure up to 6 mm. Negative sonographic Murphy sign. Gallbladder wall thickness 2.2 mm Common bile duct: Diameter: 3.8 mm Liver: Echogenic liver without focal mass lesion. No ascites in the right upper quadrant IMPRESSION: Cholelithiasis without gallbladder wall thickening. No biliary dilatation Echogenic liver compatible with fatty infiltration. Electronically Signed   By: Franchot Gallo M.D.   On: 10/09/2015 08:22    Microbiology: Recent Results (from the past 240 hour(s))  Urine culture     Status: Abnormal   Collection Time: 10/08/15  4:57 PM  Result Value Ref Range Status   Specimen Description URINE, RANDOM  Final   Special Requests NONE  Final   Culture MULTIPLE SPECIES PRESENT, SUGGEST RECOLLECTION (A)  Final   Report Status 10/09/2015 FINAL  Final  Blood culture  (routine x 2)     Status: None (Preliminary result)   Collection Time: 10/08/15  6:20 PM  Result Value Ref Range Status   Specimen Description BLOOD LEFT WRIST  Final   Special Requests BOTTLES DRAWN AEROBIC AND ANAEROBIC 5CC  Final   Culture NO GROWTH < 24 HOURS  Final   Report Status PENDING  Incomplete  Blood culture (routine x 2)     Status: None (Preliminary result)   Collection Time: 10/08/15  6:28 PM  Result Value Ref Range Status   Specimen Description BLOOD LEFT ANTECUBITAL  Final   Special Requests BOTTLES DRAWN AEROBIC AND ANAEROBIC 5CC  Final   Culture NO GROWTH < 24 HOURS  Final   Report Status PENDING  Incomplete     Labs: CBC:  Recent Labs Lab 10/08/15 1617 10/09/15 0112  WBC 7.7 5.9  NEUTROABS 6.2  --   HGB 12.6 11.2*  HCT 38.1 34.5*  MCV 91.8 93.2  PLT 196 0000000   Basic Metabolic Panel:  Recent Labs Lab 10/08/15 1617 10/09/15 0112  NA 129* 136  K 4.2 4.5  CL 102 111  CO2 19* 21*  GLUCOSE 123* 97  BUN 13 10  CREATININE 0.96 1.05*  CALCIUM 8.0* 7.3*   Liver Function Tests:  Recent Labs Lab 10/08/15 1617 10/09/15 0112  AST 33 32  ALT 30 27  ALKPHOS 74 58  BILITOT 0.8 0.8  PROT 6.7 5.3*  ALBUMIN 3.2* 2.6*    Recent Labs Lab 10/08/15 1617  LIPASE 21   No results for input(s): AMMONIA in the last 168 hours. Cardiac Enzymes:  Recent Labs Lab 10/08/15 1812 10/09/15 0112  CKTOTAL 148  --   TROPONINI  --  <0.03   Time spent: 30 minutes  Signed:  Berle Mull  [facility]  10/09/2015  , 4:38 PM

## 2015-10-13 LAB — CULTURE, BLOOD (ROUTINE X 2)
CULTURE: NO GROWTH
Culture: NO GROWTH

## 2015-10-30 ENCOUNTER — Ambulatory Visit: Payer: Self-pay | Admitting: Family Medicine

## 2017-09-06 IMAGING — CT CT ANGIO CHEST
2 of 7 series · 18 of 36 positions shown · IV contrast (Omni 300)
Comparison: Chest radiograph earlier this day.

CLINICAL DATA: Tachycardia.

EXAM:
CT ANGIOGRAPHY CHEST WITH CONTRAST
TECHNIQUE: Multidetector CT imaging of the chest was performed using the
standard protocol during bolus administration of intravenous
contrast. Multiplanar CT image reconstructions and MIPs were
obtained to evaluate the vascular anatomy.
CONTRAST:  100 cc Isovue 370 IV

[Series 6: pe thins · axial · 0.59mm/px · z∈[+1210,+1438]mm · 17 of 515 slices shown]
[im 29/515  lung]
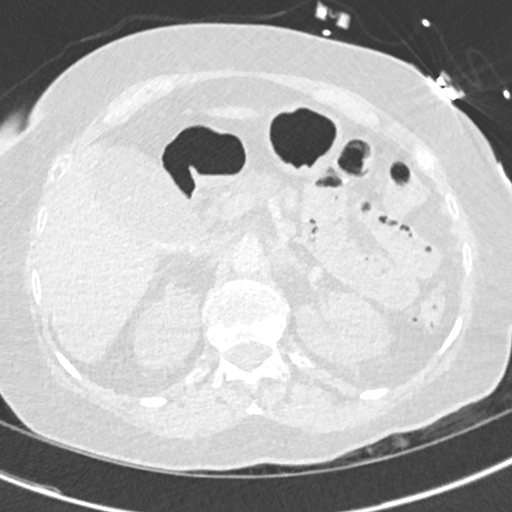
[im 58/515  mediastinal]
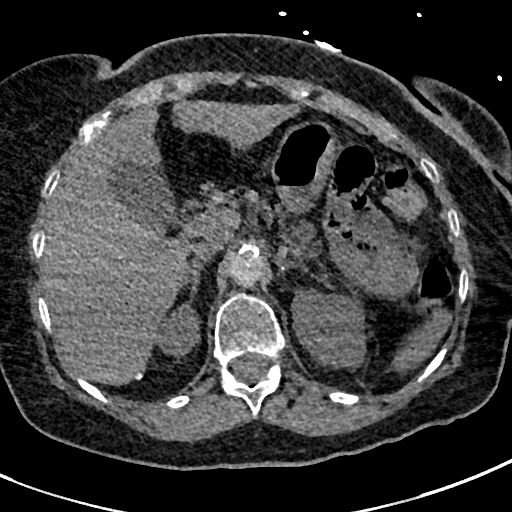
[im 86/515  lung]
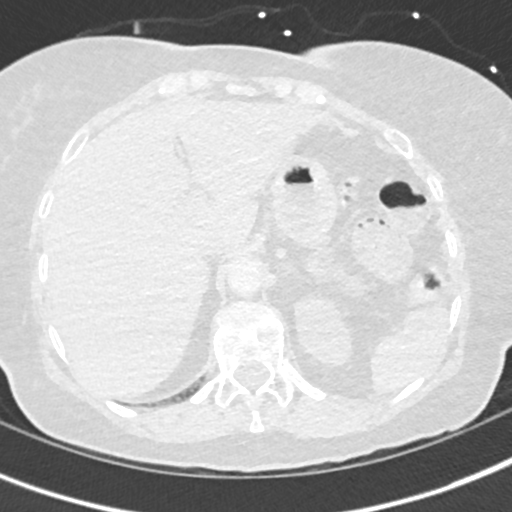
[im 115/515  mediastinal]
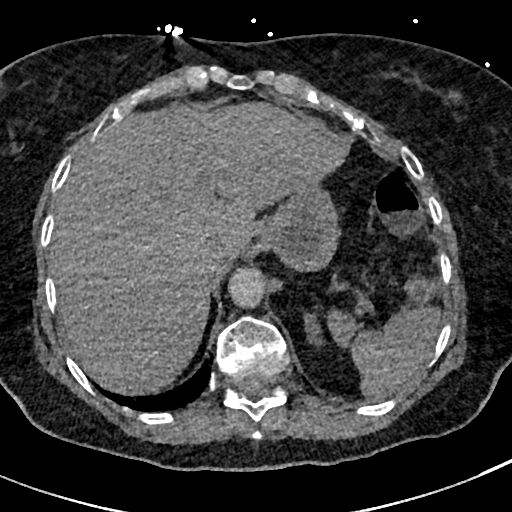
[im 143/515  lung]
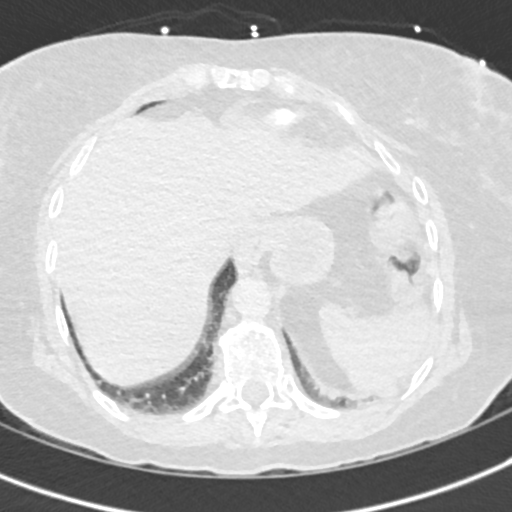
[im 172/515  mediastinal]
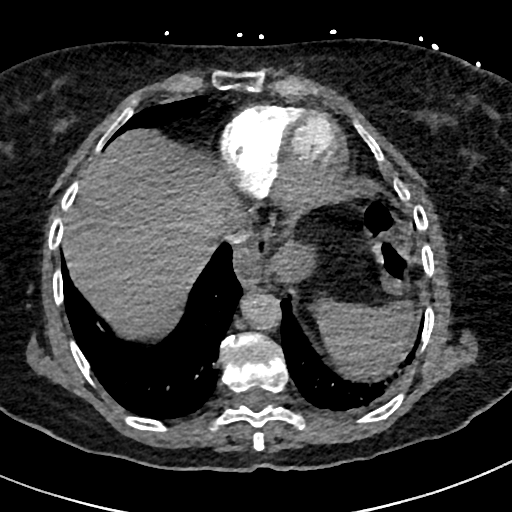
[im 200/515  lung]
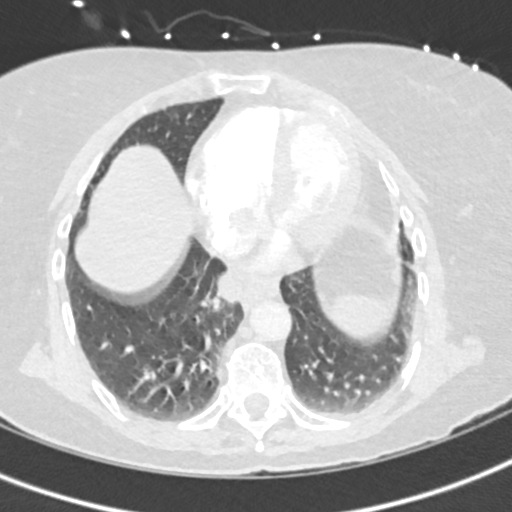
[im 229/515  mediastinal]
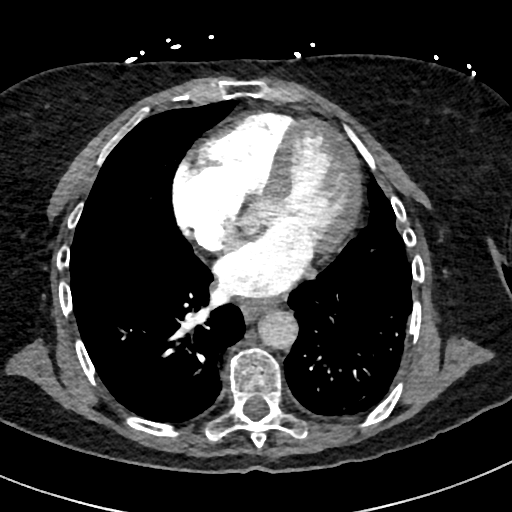
[im 258/515  lung]
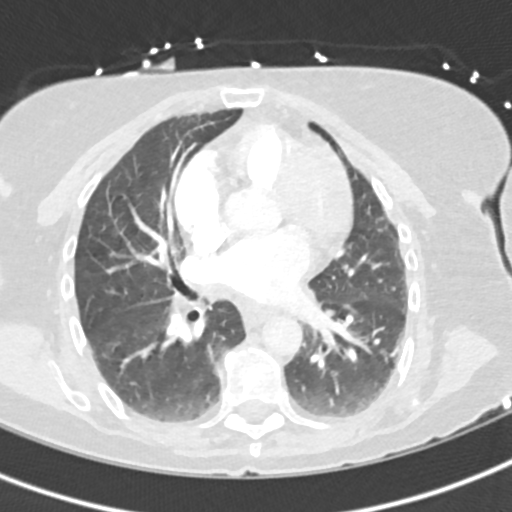
[im 286/515  mediastinal]
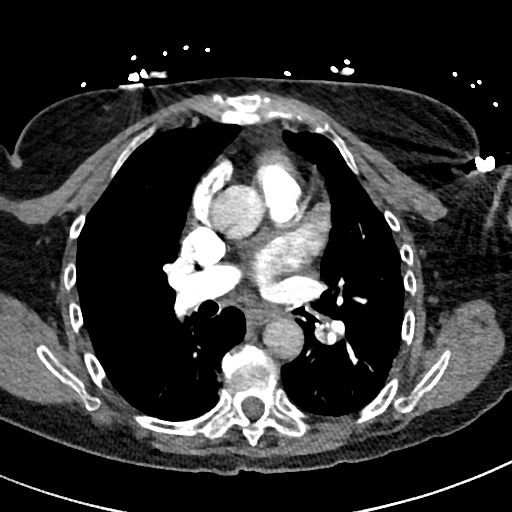
[im 315/515  lung]
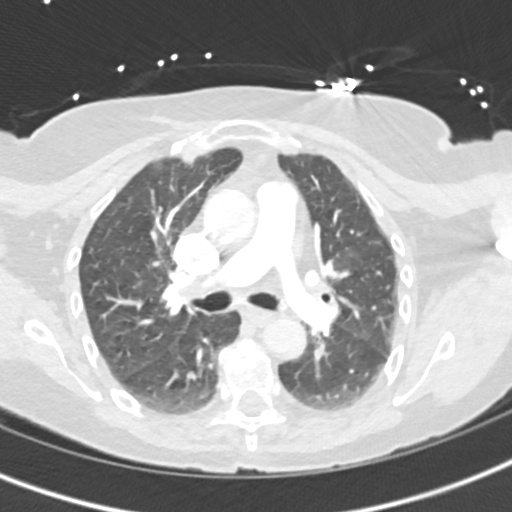
[im 343/515  mediastinal]
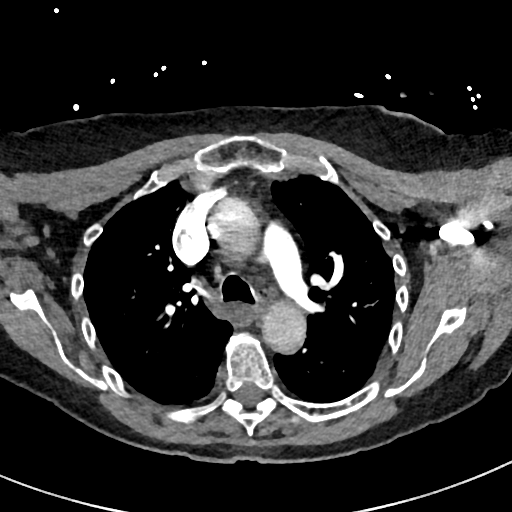
[im 372/515  lung]
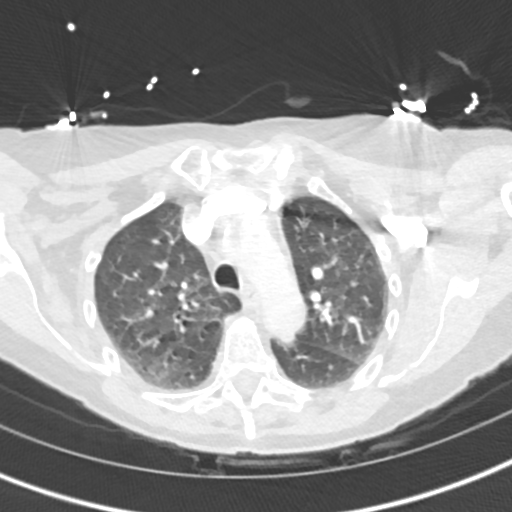
[im 400/515  mediastinal]
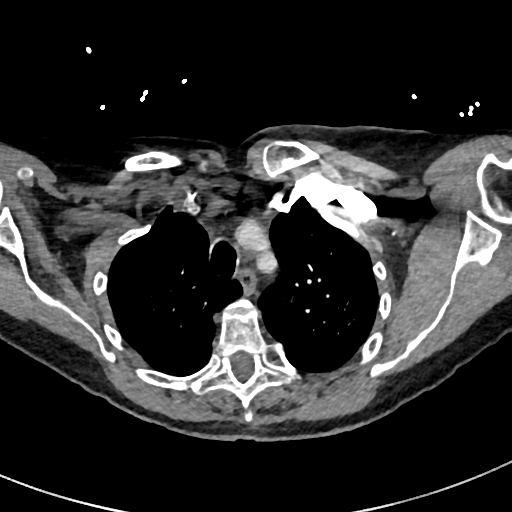
[im 429/515  lung]
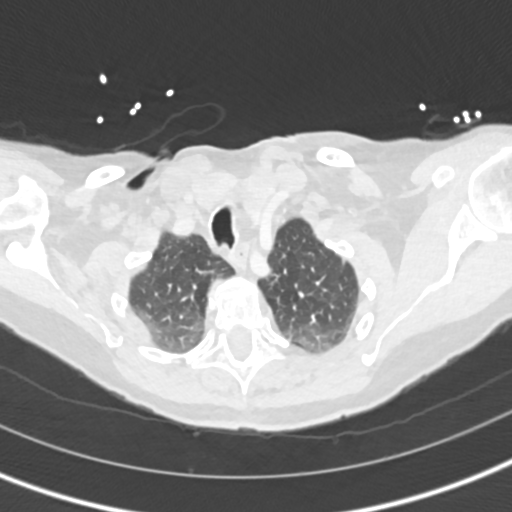
[im 457/515  mediastinal]
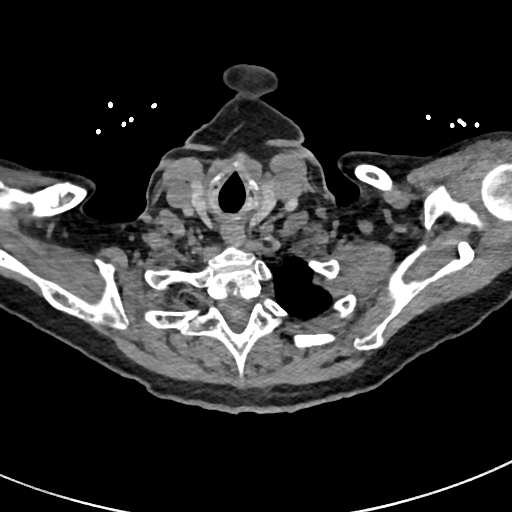
[im 486/515  lung]
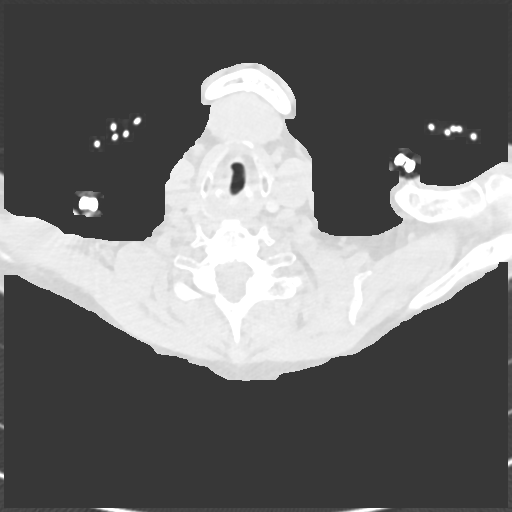

[Series 7: pe 2mm cor · coronal · 0.50mm/px · 1 of 124 slices shown]
[im 62/124  mediastinal]
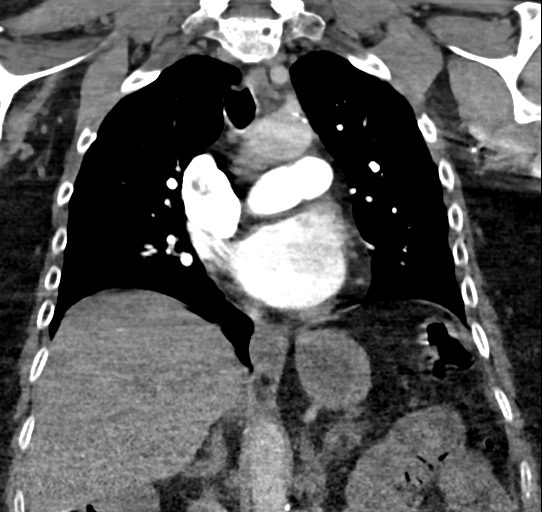

[18 of 36 positions shown; findings below may reference images not displayed]

FINDINGS: There are no filling defects within the pulmonary arteries to
suggest pulmonary embolus.

Normal caliber thoracic aorta with minimal atherosclerosis. No
evidence of dissection. No mediastinal or hilar adenopathy. No
pericardial effusion.

Motion artifact through the lung apices. Mild atelectasis in the
lingula and right middle lobe. No confluent airspace disease,
particularly in the right middle lobe. No findings of pulmonary
edema.

Mild distal esophageal wall thickening. Upper abdomen evaluation
demonstrates calcified gallstones with and prominent gallbladder.

Degenerative change in the thoracic spine. There are no acute or
suspicious osseous abnormalities.

Review of the MIP images confirms the above findings.
IMPRESSION: 1. No pulmonary embolus.
2. Atelectasis in the right middle lobe and lingula. No confluent
airspace disease to suggest pneumonia.
3. Cholelithiasis noted in the upper abdomen.

## 2018-05-08 IMAGING — US US ABDOMEN LIMITED
1 series · 14 of 25 positions shown · non-contrast
Comparison: None.

CLINICAL DATA: Abdominal pain

EXAM:
US ABDOMEN LIMITED - RIGHT UPPER QUADRANT

[Series 1: us abdomen limited · 0.25mm/px · 14 of 40 slices shown]
[im 1/40]
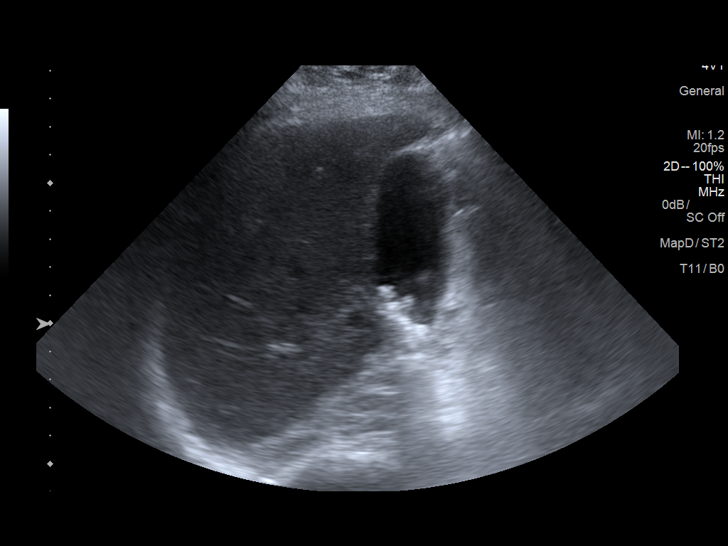
[im 4/40]
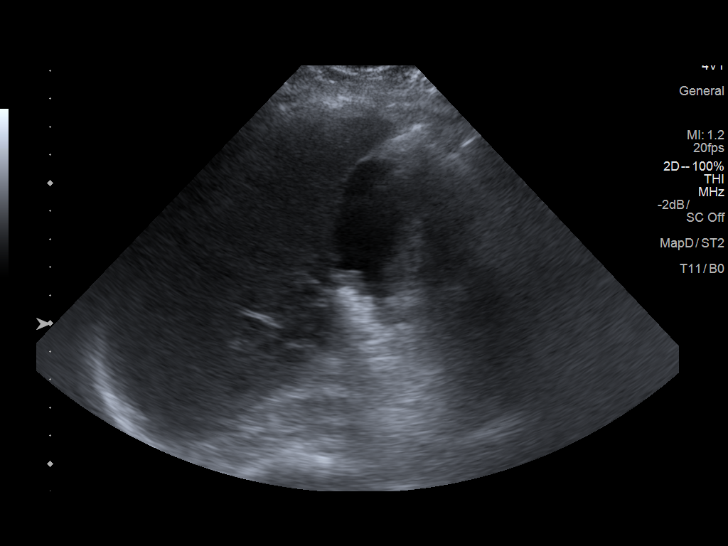
[im 7/40]
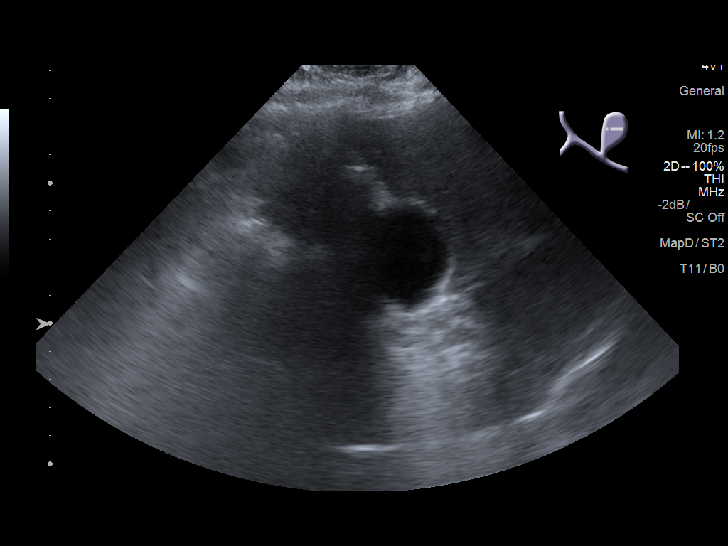
[im 10/40]
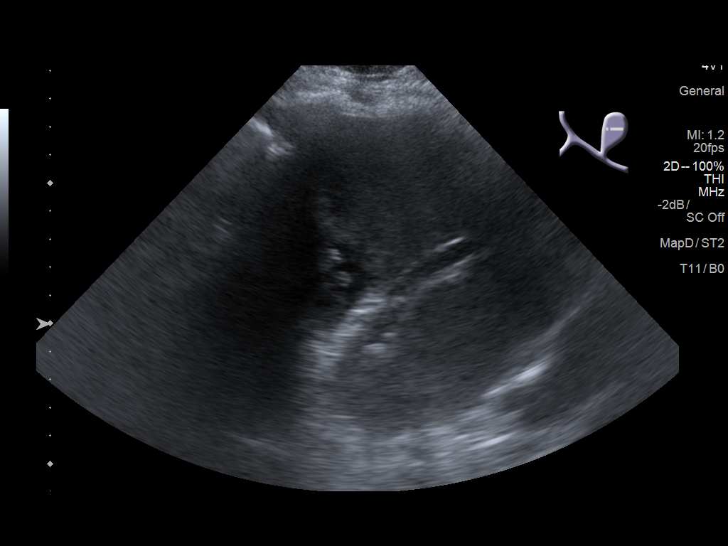
[im 14/40]
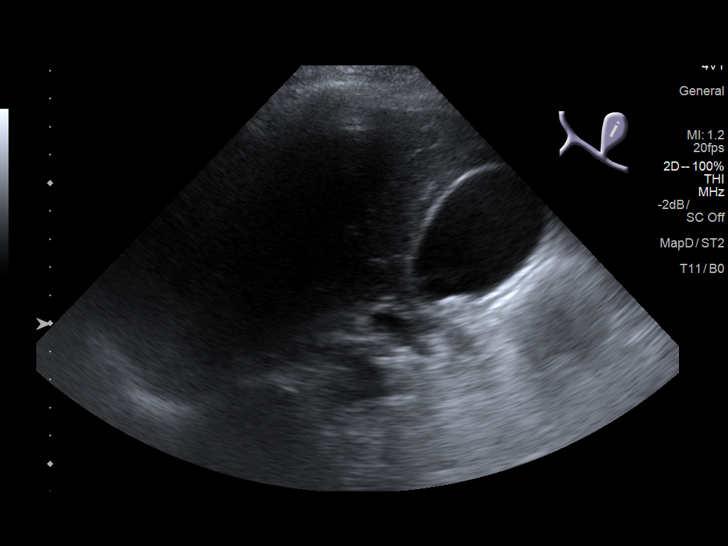
[im 15/40]
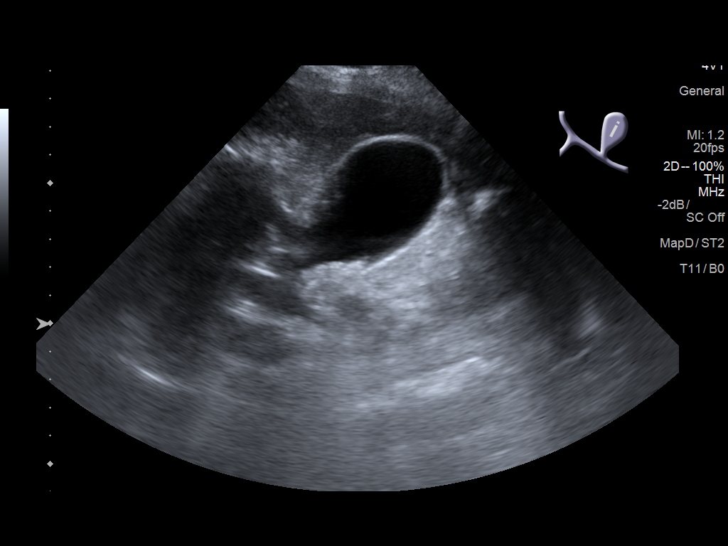
[im 18/40]
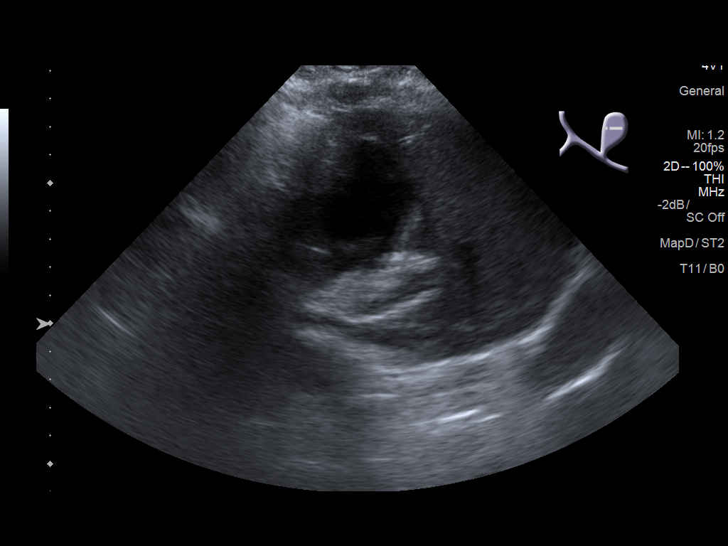
[im 22/40]
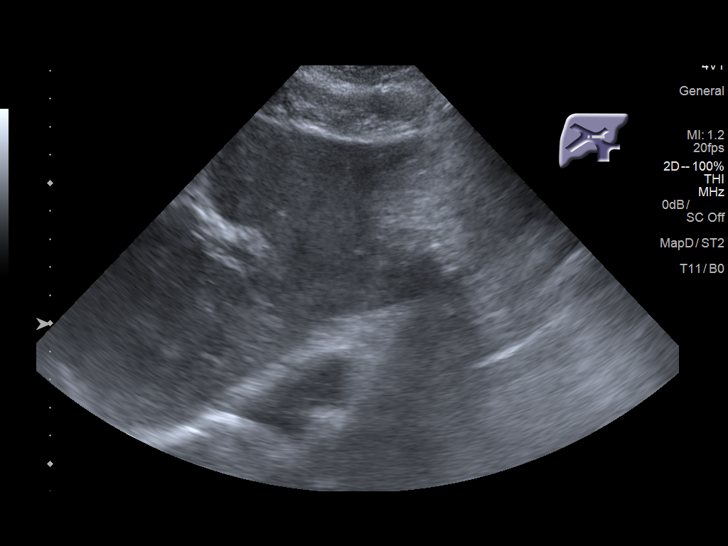
[im 25/40]
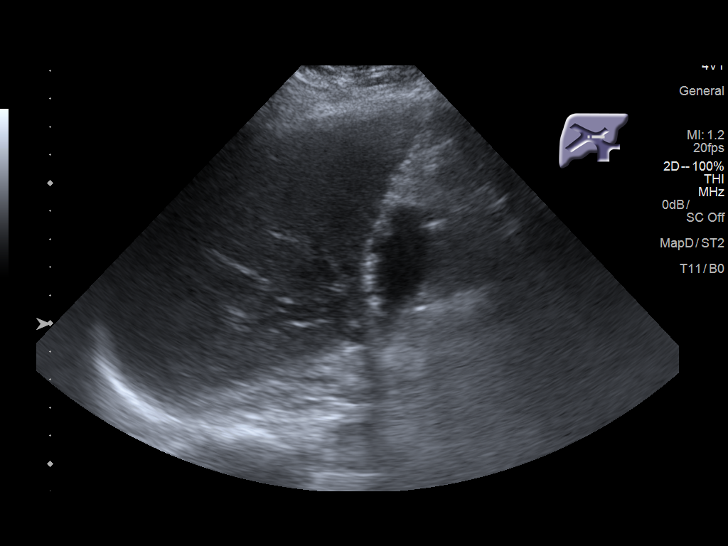
[im 27/40]
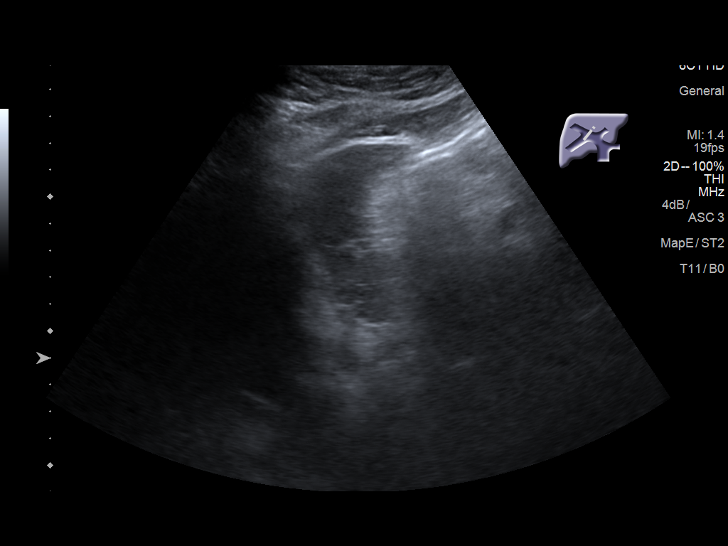
[im 30/40]
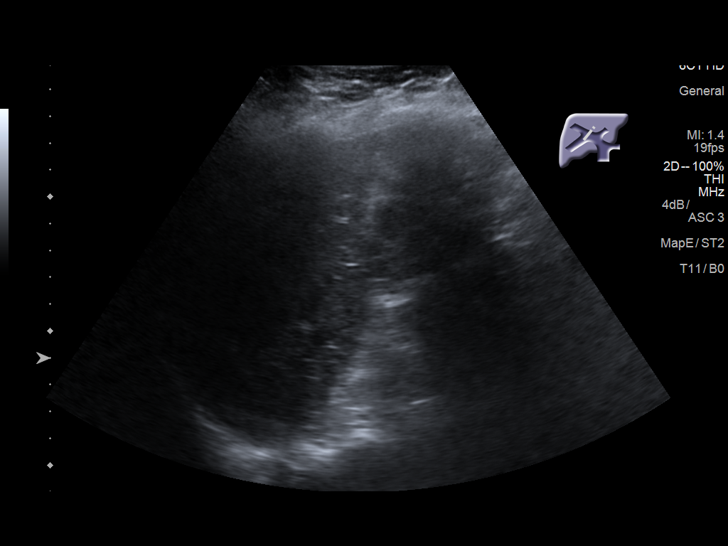
[im 33/40]
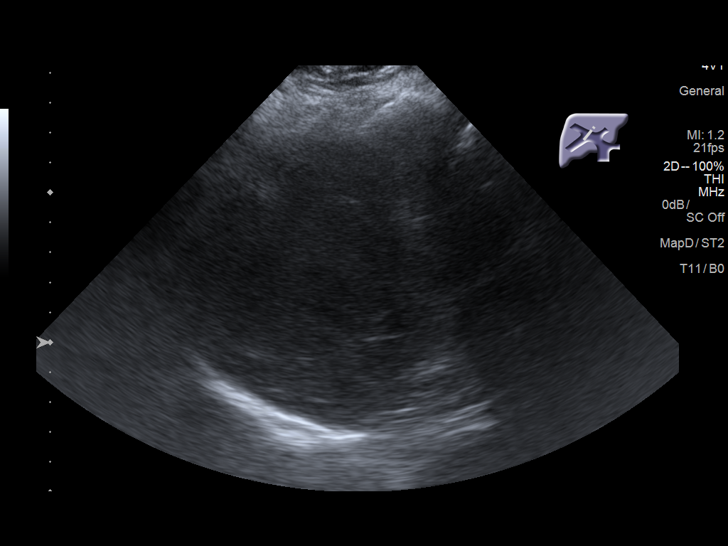
[im 36/40]
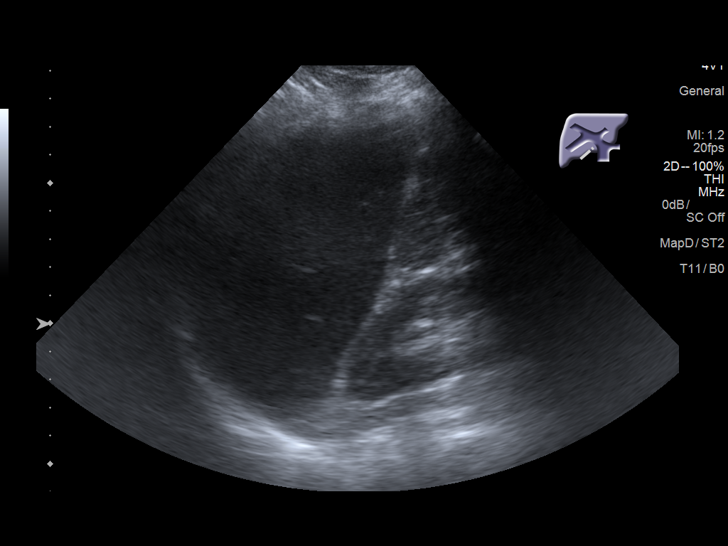
[im 40/40]
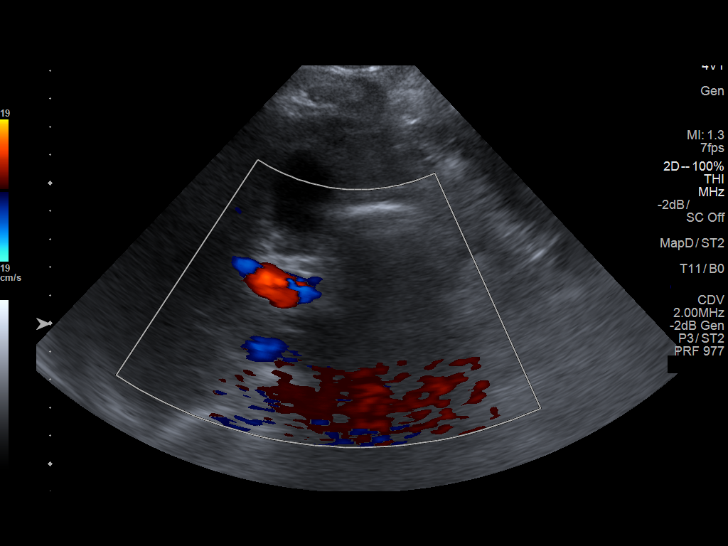

[14 of 25 positions shown; findings below may reference images not displayed]

FINDINGS: Gallbladder:

Cholelithiasis. Gallstones measure up to 6 mm. Negative sonographic
Murphy sign. Gallbladder wall thickness 2.2 mm

Common bile duct:

Diameter: 3.8 mm

Liver:

Echogenic liver without focal mass lesion. No ascites in the right
upper quadrant
IMPRESSION: Cholelithiasis without gallbladder wall thickening. No biliary
dilatation

Echogenic liver compatible with fatty infiltration.
